# Patient Record
Sex: Female | Born: 1970 | Race: Black or African American | Hispanic: No | Marital: Married
Health system: Southern US, Community
[De-identification: ages and names within clinical notes are randomized; demographics above are authoritative.]

## PROBLEM LIST (undated history)

## (undated) HISTORY — PX: BREAST CYST ASPIRATION: SHX578

---

## 2001-11-26 ENCOUNTER — Ambulatory Visit (HOSPITAL_COMMUNITY): Admission: RE | Admit: 2001-11-26 | Discharge: 2001-11-26 | Payer: Self-pay | Admitting: General Surgery

## 2006-08-10 ENCOUNTER — Encounter: Admission: RE | Admit: 2006-08-10 | Discharge: 2006-08-10 | Payer: Self-pay | Admitting: Internal Medicine

## 2020-05-29 ENCOUNTER — Other Ambulatory Visit: Payer: Self-pay

## 2020-05-29 ENCOUNTER — Ambulatory Visit: Payer: Self-pay

## 2020-05-29 DIAGNOSIS — Z23 Encounter for immunization: Secondary | ICD-10-CM

## 2021-05-17 ENCOUNTER — Ambulatory Visit: Payer: Self-pay | Admitting: Medical

## 2021-05-17 ENCOUNTER — Other Ambulatory Visit: Payer: Self-pay

## 2021-05-17 DIAGNOSIS — Z23 Encounter for immunization: Secondary | ICD-10-CM

## 2021-05-17 NOTE — Progress Notes (Signed)
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

## 2021-05-28 ENCOUNTER — Other Ambulatory Visit: Payer: Self-pay | Admitting: Internal Medicine

## 2021-05-28 DIAGNOSIS — Z139 Encounter for screening, unspecified: Secondary | ICD-10-CM

## 2021-06-09 ENCOUNTER — Other Ambulatory Visit: Payer: Self-pay

## 2021-06-09 ENCOUNTER — Ambulatory Visit
Admission: RE | Admit: 2021-06-09 | Discharge: 2021-06-09 | Disposition: A | Discharge status: Home / Self Care | Payer: BC Managed Care – PPO | Source: Ambulatory Visit | Attending: Internal Medicine | Admitting: Internal Medicine

## 2021-06-09 DIAGNOSIS — Z139 Encounter for screening, unspecified: Secondary | ICD-10-CM

## 2022-06-13 ENCOUNTER — Other Ambulatory Visit: Payer: Self-pay | Admitting: Internal Medicine

## 2022-06-13 DIAGNOSIS — Z1231 Encounter for screening mammogram for malignant neoplasm of breast: Secondary | ICD-10-CM

## 2022-07-06 ENCOUNTER — Ambulatory Visit
Admission: RE | Admit: 2022-07-06 | Discharge: 2022-07-06 | Disposition: A | Discharge status: Home / Self Care | Payer: BC Managed Care – PPO | Source: Ambulatory Visit | Attending: Internal Medicine | Admitting: Internal Medicine

## 2022-07-06 DIAGNOSIS — Z1231 Encounter for screening mammogram for malignant neoplasm of breast: Secondary | ICD-10-CM

## 2022-12-17 IMAGING — MG MM DIGITAL SCREENING BILAT W/ TOMO AND CAD
6 of 10 series · 6 of 30 positions shown · non-contrast
Comparison: Previous exam(s).

CLINICAL DATA: Screening.

EXAM:
DIGITAL SCREENING BILATERAL MAMMOGRAM WITH TOMOSYNTHESIS AND CAD
TECHNIQUE: Bilateral screening digital craniocaudal and mediolateral oblique
mammograms were obtained. Bilateral screening digital breast
tomosynthesis was performed. The images were evaluated with
computer-aided detection.

[R MLO synth-2D]
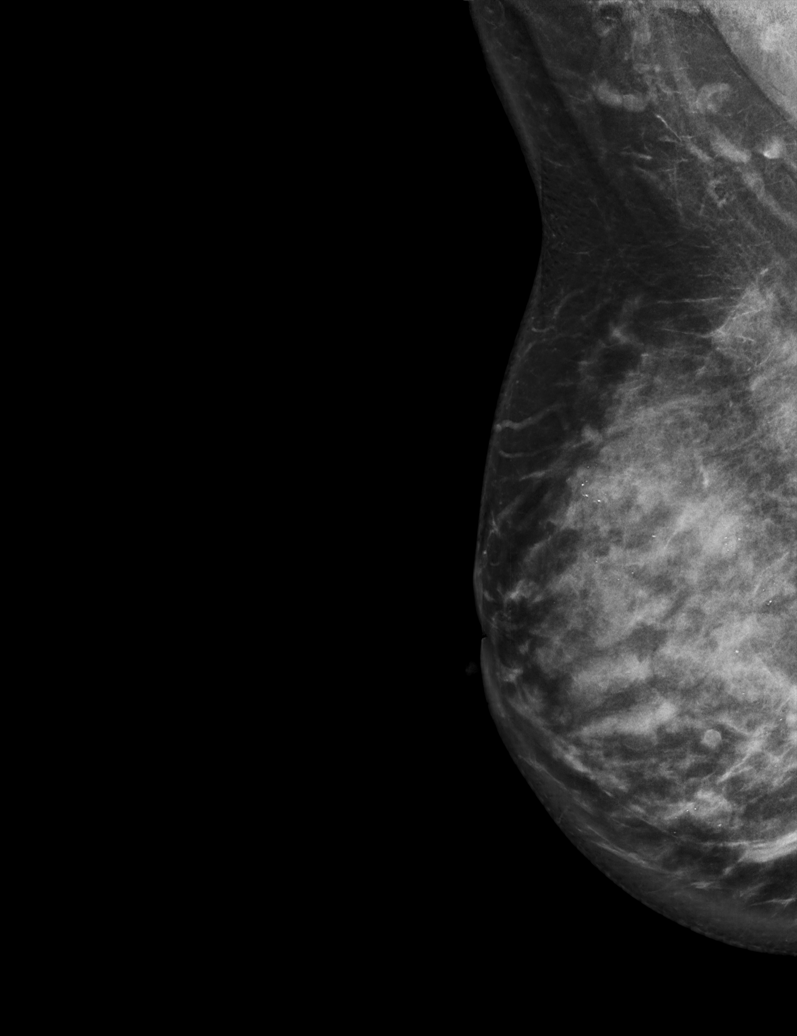

[R CC synth-2D (1 of 2)]
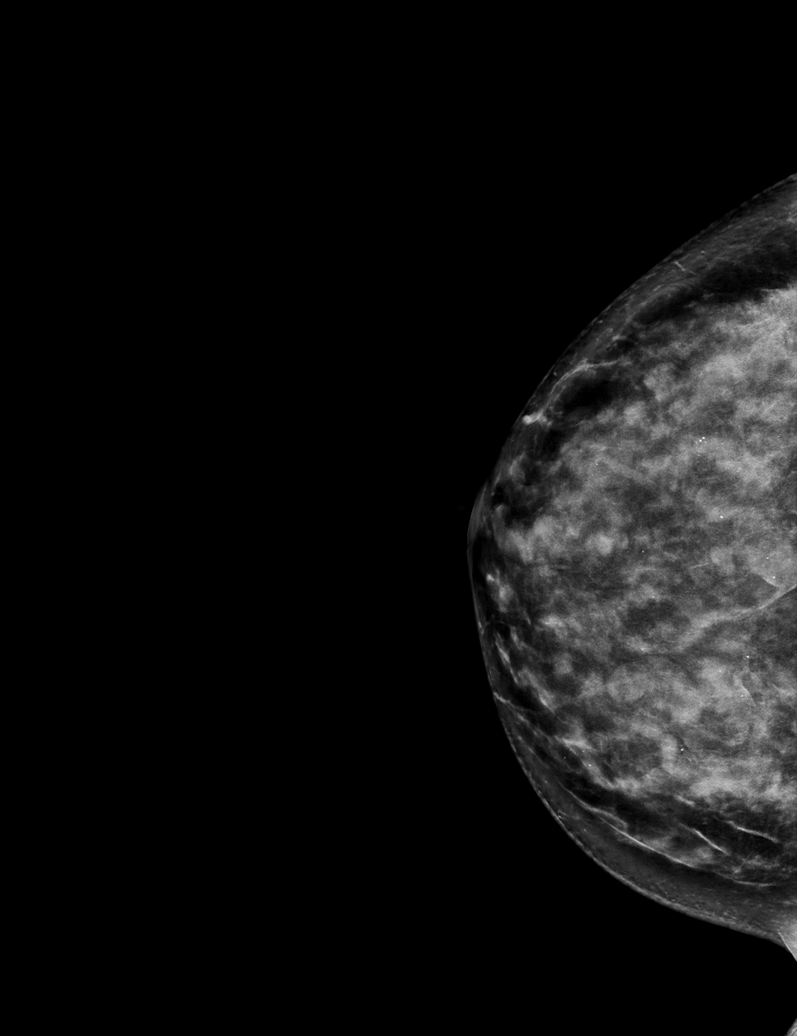

[R CC synth-2D (2 of 2)]
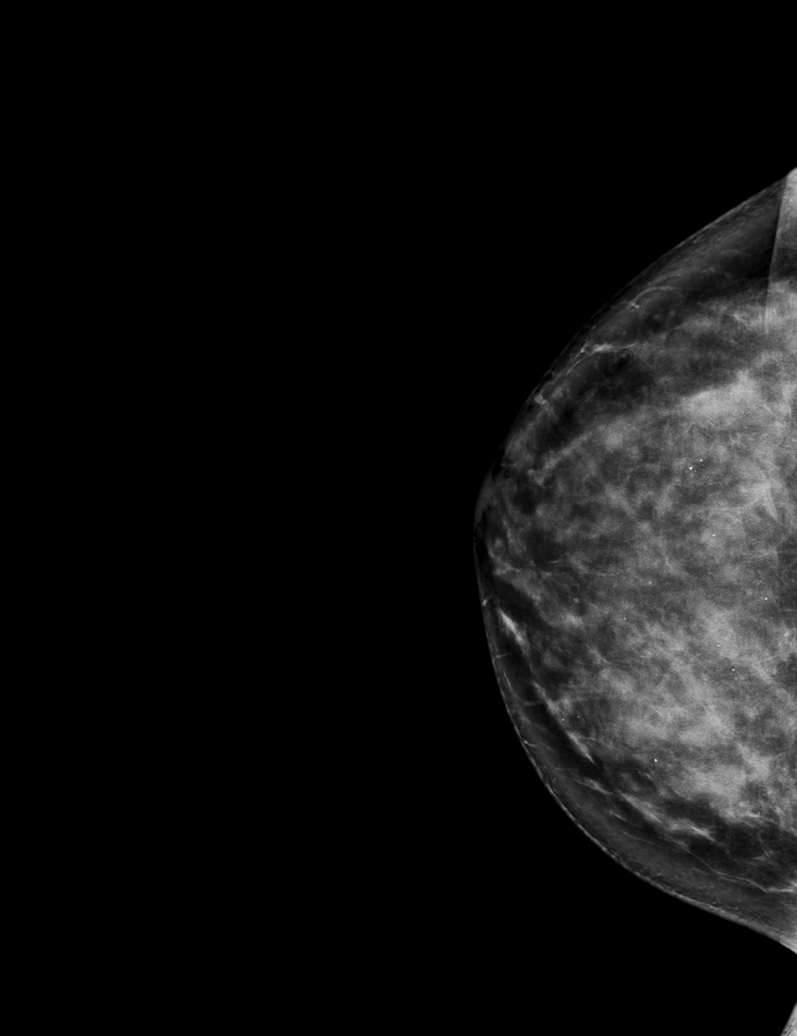

[L MLO synth-2D]
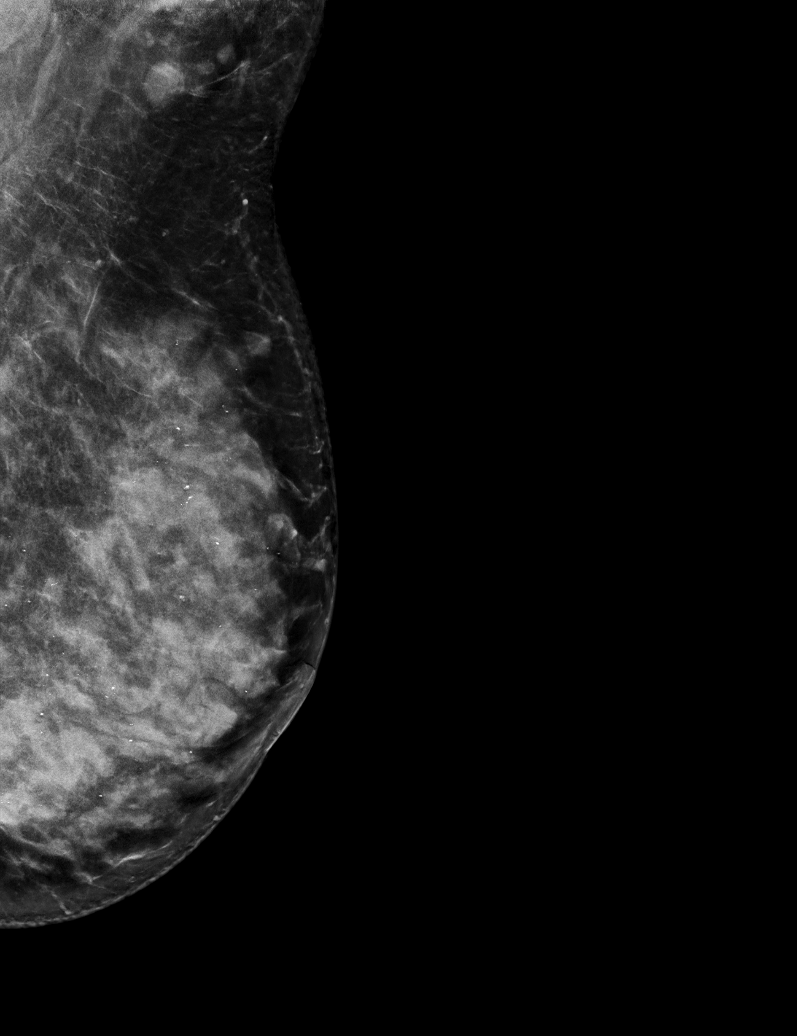

[L CC synth-2D]
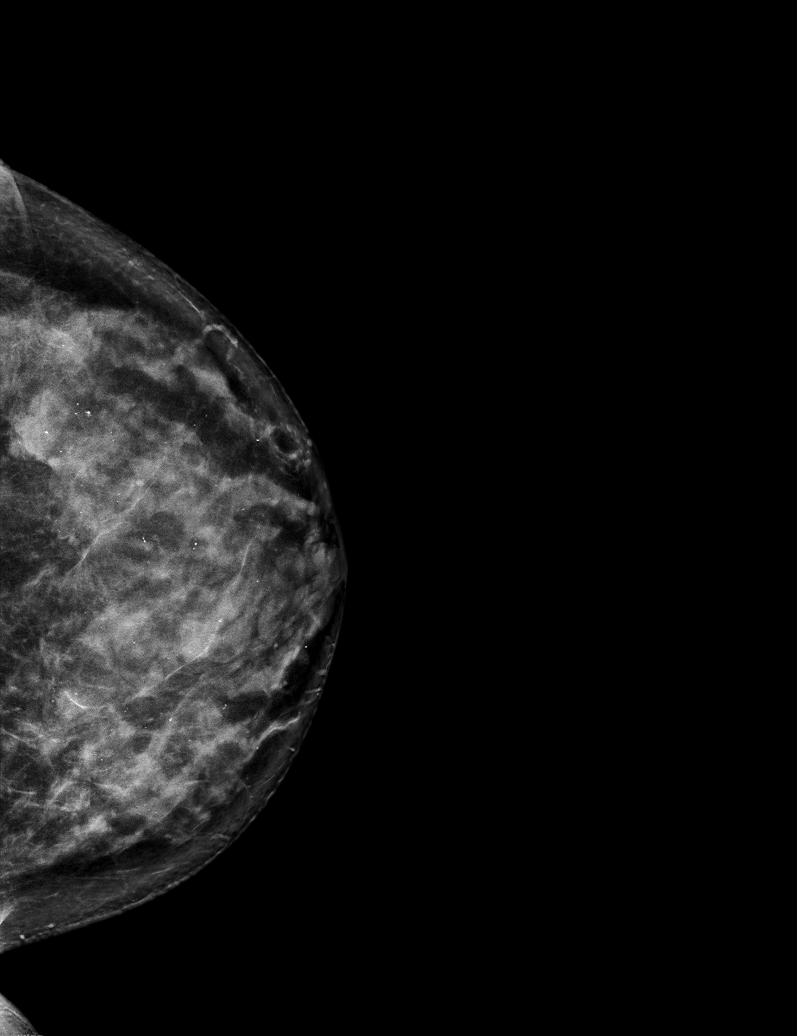

[R CC tomo · tomo slice 37/73.0]
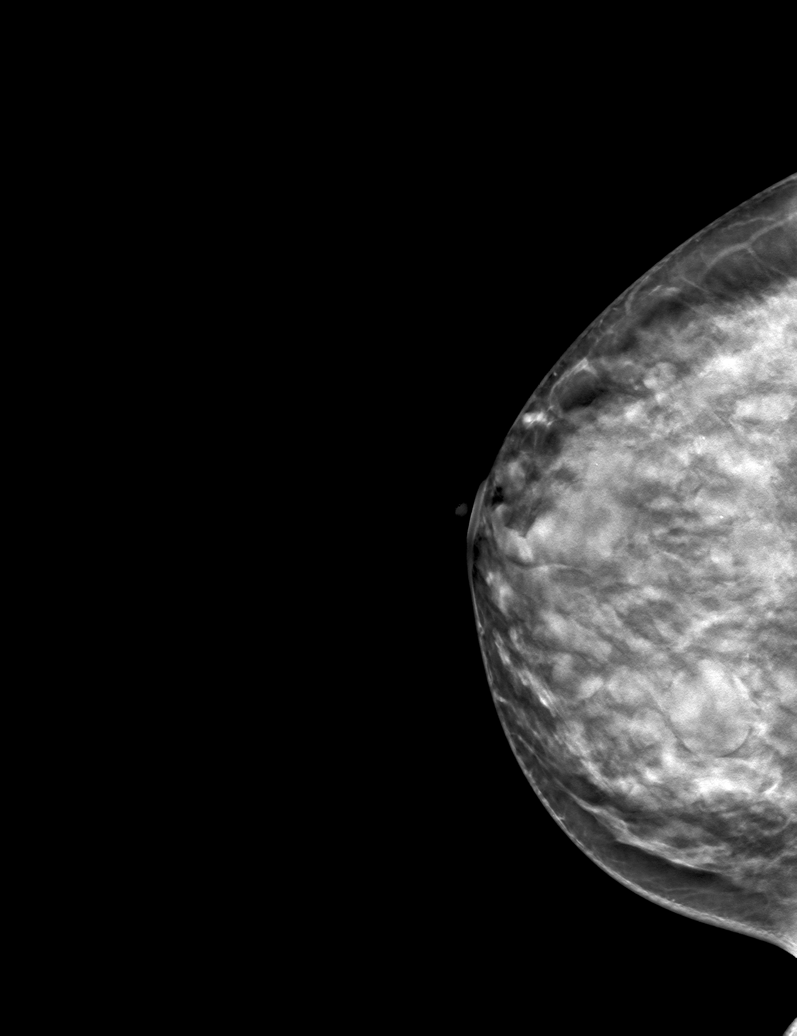

[6 of 30 positions shown; findings below may reference images not displayed]

ACR Breast Density Category c: The breast tissue is heterogeneously
dense, which may obscure small masses.
FINDINGS: There are no findings suspicious for malignancy.
IMPRESSION: No mammographic evidence of malignancy. A result letter of this
screening mammogram will be mailed directly to the patient.

RECOMMENDATION:
Screening mammogram in one year. (Code:Q3-W-BC3)

BI-RADS CATEGORY  1: Negative.

## 2023-07-06 ENCOUNTER — Other Ambulatory Visit: Payer: Self-pay | Admitting: Internal Medicine

## 2023-07-06 DIAGNOSIS — Z1231 Encounter for screening mammogram for malignant neoplasm of breast: Secondary | ICD-10-CM

## 2023-07-25 ENCOUNTER — Ambulatory Visit
Admission: RE | Admit: 2023-07-25 | Discharge: 2023-07-25 | Disposition: A | Discharge status: Home / Self Care | Payer: 59 | Source: Ambulatory Visit | Attending: Internal Medicine | Admitting: Internal Medicine

## 2023-07-25 DIAGNOSIS — Z1231 Encounter for screening mammogram for malignant neoplasm of breast: Secondary | ICD-10-CM
